# Patient Record
Sex: Male | Born: 1990 | Race: Black or African American | Hispanic: No | Marital: Single | Smoking: Current every day smoker
Health system: Southern US, Community
[De-identification: ages and names within clinical notes are randomized; demographics above are authoritative.]

## PROBLEM LIST (undated history)

## (undated) DIAGNOSIS — R569 Unspecified convulsions: Secondary | ICD-10-CM

---

## 2020-02-21 ENCOUNTER — Ambulatory Visit (HOSPITAL_COMMUNITY)
Admission: EM | Admit: 2020-02-21 | Discharge: 2020-02-21 | Disposition: A | Discharge status: Home / Self Care | Payer: Medicaid Other | Attending: Physician Assistant | Admitting: Physician Assistant

## 2020-02-21 ENCOUNTER — Encounter (HOSPITAL_COMMUNITY): Payer: Self-pay

## 2020-02-21 ENCOUNTER — Other Ambulatory Visit: Payer: Self-pay

## 2020-02-21 DIAGNOSIS — K047 Periapical abscess without sinus: Secondary | ICD-10-CM

## 2020-02-21 DIAGNOSIS — R21 Rash and other nonspecific skin eruption: Secondary | ICD-10-CM

## 2020-02-21 HISTORY — DX: Unspecified convulsions: R56.9

## 2020-02-21 MED ORDER — AMOXICILLIN-POT CLAVULANATE 875-125 MG PO TABS: 1.0000 | ORAL_TABLET | Freq: Two times a day (BID) | ORAL | 0 refills | Status: AC

## 2020-02-21 MED ORDER — PREDNISONE 10 MG PO TABS: 40.0000 mg | ORAL_TABLET | Freq: Every day | ORAL | 0 refills | Status: AC

## 2020-02-21 MED ORDER — HYDROXYZINE HCL 25 MG PO TABS: 25.0000 mg | ORAL_TABLET | Freq: Four times a day (QID) | ORAL | 0 refills | Status: DC

## 2020-02-21 NOTE — ED Triage Notes (Signed)
Pt c/o itching/rash ro bilateral arms, legs for two days. Took 50mg  Benadryl 0800. Denies SOB, facial or difficulty swallowing.  Diffuse red/raised rash noted to arms/legs. Pt somnolent, closing eyes.

## 2020-02-21 NOTE — ED Provider Notes (Addendum)
[name]    CSN: [id number] Arrival date & time: 02/21/20  0900      History   Chief Complaint Chief Complaint  Patient presents with  . Rash    HPI Eddie Sullivan is a 29 y.o. male.   Patient presents today to urgent care for complaint of rash on arms.  Reports the rash started yesterday and has been very itchy.  He also reports some itching on his legs.  He took Benadryl this morning prior to coming in is helped a little bit.  He denies fever and chills, difficulty breathing or throat swelling.  He does not have any known allergies and has not changed lotions or soaps or laundry detergents.  Denies sore throat, cough, congestion, headache.  He also reports a painful wisdom tooth that is cracked.  This has been present for some time.  However it has been hurting more recently.  He reports he is going to follow-up with a dentist about this.  Denies taking other medications or drugs other than Benadryl.     Past Medical History:  Diagnosis Date  . Seizures ([place])     There are no problems to display for this patient.   History reviewed. No pertinent surgical history.     Home Medications    Prior to Admission medications   Medication Sig Start Date End Date Taking? Authorizing Provider  amoxicillin-clavulanate (AUGMENTIN) 875-125 MG tablet Take 1 tablet by mouth 2 (two) times daily for 10 days. 02/21/20 03/02/20  Chelan Heringer, Marguerita Beards, PA-C  hydrOXYzine (ATARAX/VISTARIL) 25 MG tablet Take 1 tablet (25 mg total) by mouth every 6 (six) hours. 02/21/20   Areyanna Figeroa, Marguerita Beards, PA-C  predniSONE (DELTASONE) 10 MG tablet Take 4 tablets (40 mg total) by mouth daily for 3 days. 02/21/20 02/24/20  Kenlie Seki, Marguerita Beards, PA-C    Family History Family History  Family history unknown: Yes    Social History Social History   Tobacco Use  . Smoking status: Current Some Day Smoker  . Smokeless tobacco: Never Used  Substance Use Topics  . Alcohol use: Yes  . Drug use: Yes    Types:  Marijuana     Allergies   Patient has no known allergies.   Review of Systems Review of Systems  Constitutional: Negative for chills and fever.  HENT: Positive for dental problem. Negative for facial swelling, sore throat and trouble swallowing.   Respiratory: Negative for cough and shortness of breath.   Cardiovascular: Negative for chest pain.  Skin: Positive for color change and rash.  Neurological: Negative for dizziness, speech difficulty and light-headedness.     Physical Exam Triage Vital Signs ED Triage Vitals  Enc Vitals Group     BP 02/21/20 0919 121/84     Pulse Rate 02/21/20 0919 72     Resp 02/21/20 0919 16     Temp 02/21/20 0919 (!) 97 F (36.1 C)     Temp Source 02/21/20 0919 Oral     SpO2 02/21/20 0919 100 %     Weight --      Height --      Head Circumference --      Peak Flow --      Pain Score 02/21/20 0916 0     Pain Loc --      Pain Edu? --      Excl. in [place]? --    No data found.  Updated Vital Signs BP 121/84 (BP Location: Right Arm)  Pulse 72   Temp (!) 97 F (36.1 C) (Oral)   Resp 16   SpO2 100%   Visual Acuity Right Eye Distance:   Left Eye Distance:   Bilateral Distance:    Right Eye Near:   Left Eye Near:    Bilateral Near:     Physical Exam Vitals and nursing note reviewed.  Constitutional:      Appearance: Normal appearance. He is well-developed. He is not ill-appearing.     Comments: Sleepy demeanor.  Wearing sunglasses and listening to music as I entered the room.    HENT:     Head: Normocephalic and atraumatic.     Mouth/Throat:     Lips: Pink.     Mouth: Mucous membranes are moist. Mucous membranes are cyanotic. No oral lesions or angioedema.     Dentition: Abnormal dentition. Dental tenderness and gingival swelling present.     Tongue: No lesions.     Pharynx: Oropharynx is clear. Uvula midline. No uvula swelling.   Eyes:     General: No scleral icterus.    Extraocular Movements: Extraocular movements  intact.     Conjunctiva/sclera: Conjunctivae normal.     Pupils: Pupils are equal, round, and reactive to light.     Comments: No vascular lesions noted in sclera  Cardiovascular:     Rate and Rhythm: Normal rate and regular rhythm.     Pulses: Normal pulses.     Heart sounds: No murmur.  Pulmonary:     Effort: Pulmonary effort is normal. No respiratory distress.     Breath sounds: Normal breath sounds.  Musculoskeletal:     Cervical back: Neck supple.  Skin:    General: Skin is warm and dry.     Comments: Right antecubital space with evidence of needle mark.  Not infected.  Fine papular coalescing erythematous and blanching rash on forearms and elbows.  Rash is not present in other locations there are images below  No splinter hemorrhaging.  No lesions between fingers or toes  Neurological:     Mental Status: He is alert and oriented to person, place, and time.     Comments: Patient is sleepy and found to be dozing off in exam room upon entry.  However he responds well to conversation and answers all questions appropriately.  States he believes this is due to not sleeping much and taking Benadryl this morning.  Psychiatric:        Mood and Affect: Mood normal.        Behavior: Behavior normal.        Thought Content: Thought content normal.        Judgment: Judgment normal.          UC Treatments / Results  Labs (all labs ordered are listed, but only abnormal results are displayed) Labs Reviewed - No data to display  EKG   Radiology No results found.  Procedures Procedures (including critical care time)  Medications Ordered in UC Medications - No data to display  Initial Impression / Assessment and Plan / UC Course  I have reviewed the triage vital signs and the nursing notes.  Pertinent labs & imaging results that were available during my care of the patient were reviewed by me and considered in my medical decision making (see chart for details).      #Rash #Dental infection Patient 28 year old male presenting with skin rash and complaint of dental infection.  Rashes consistent with urticarial reaction vs contact irritant vs contact  allergy.  Patient also has apparent dental infection.  Will start on prednisone for 3 days and Augmentin x10 days.  Hydroxyzine for itching.  Strict emergency department precautions for worsening rash, swelling of the throat or tongue or difficulty breathing.  Patient reports he has a ride coming to pick him up from clinic today.  Patient has stable vital signs and alert for discharge. -Patient requested pain medications and I recommended that he take Tylenol and ibuprofen for his pain and at the antibiotic will help with his tooth pain.  Instructed him to follow-up with a dentist as soon as possible.  Final Clinical Impressions(s) / UC Diagnoses   Final diagnoses:  Rash  Dental infection     Discharge Instructions     Take the prednisone daily for the next 3 days Take the hydroxyzine at night for itching Take the Augmentin 2 times a day for 10 days and please follow-up with the dentist to discuss remove your tooth  Notice worsening rash or other symptoms such as shortness of breath or difficulty swallowing or chest pain , I like for you to return or go to the emergency department      ED Prescriptions    Medication Sig Dispense Auth. Provider   predniSONE (DELTASONE) 10 MG tablet Take 4 tablets (40 mg total) by mouth daily for 3 days. 12 tablet Marga Gramajo, Veryl Speak, PA-C   amoxicillin-clavulanate (AUGMENTIN) 875-125 MG tablet Take 1 tablet by mouth 2 (two) times daily for 10 days. 20 tablet Shawndrea Rutkowski, Veryl Speak, PA-C   hydrOXYzine (ATARAX/VISTARIL) 25 MG tablet Take 1 tablet (25 mg total) by mouth every 6 (six) hours. 12 tablet Robben Jagiello, Veryl Speak, PA-C     PDMP not reviewed this encounter.   Hermelinda Medicus, PA-C 02/21/20 1014    Emmanuell Kantz, Veryl Speak, PA-C 02/21/20 1015

## 2020-02-21 NOTE — Discharge Instructions (Signed)
Take the prednisone daily for the next 3 days Take the hydroxyzine at night for itching Take the Augmentin 2 times a day for 10 days and please follow-up with the dentist to discuss remove your tooth  Notice worsening rash or other symptoms such as shortness of breath or difficulty swallowing or chest pain , I like for you to return or go to the emergency department

## 2020-04-05 ENCOUNTER — Emergency Department (HOSPITAL_COMMUNITY): Payer: Medicaid Other

## 2020-04-05 ENCOUNTER — Emergency Department (HOSPITAL_COMMUNITY)
Admission: EM | Admit: 2020-04-05 | Discharge: 2020-04-05 | Disposition: A | Discharge status: Home / Self Care | Payer: Medicaid Other | Attending: Emergency Medicine | Admitting: Emergency Medicine

## 2020-04-05 ENCOUNTER — Other Ambulatory Visit: Payer: Self-pay

## 2020-04-05 ENCOUNTER — Encounter (HOSPITAL_COMMUNITY): Payer: Self-pay

## 2020-04-05 DIAGNOSIS — R0602 Shortness of breath: Secondary | ICD-10-CM | POA: Insufficient documentation

## 2020-04-05 DIAGNOSIS — Z113 Encounter for screening for infections with a predominantly sexual mode of transmission: Secondary | ICD-10-CM | POA: Insufficient documentation

## 2020-04-05 DIAGNOSIS — J029 Acute pharyngitis, unspecified: Secondary | ICD-10-CM | POA: Diagnosis not present

## 2020-04-05 DIAGNOSIS — F17200 Nicotine dependence, unspecified, uncomplicated: Secondary | ICD-10-CM | POA: Insufficient documentation

## 2020-04-05 DIAGNOSIS — Z20822 Contact with and (suspected) exposure to covid-19: Secondary | ICD-10-CM | POA: Insufficient documentation

## 2020-04-05 LAB — SARS CORONAVIRUS 2 (TAT 6-24 HRS): SARS Coronavirus 2: NEGATIVE

## 2020-04-05 LAB — GROUP A STREP BY PCR: Group A Strep by PCR: NOT DETECTED

## 2020-04-05 MED ORDER — LIDOCAINE VISCOUS HCL 2 % MT SOLN: 15.0000 mL | Freq: Four times a day (QID) | OROMUCOSAL | 0 refills | Status: DC | PRN

## 2020-04-05 MED ORDER — LIDOCAINE VISCOUS HCL 2 % MT SOLN
15.0000 mL | Freq: Once | OROMUCOSAL | Status: AC
  Administered 2020-04-05: 15 mL via OROMUCOSAL
  Filled 2020-04-05: qty 15

## 2020-04-05 MED ORDER — NAPROXEN 500 MG PO TABS: 500.0000 mg | ORAL_TABLET | Freq: Two times a day (BID) | ORAL | 0 refills | Status: AC

## 2020-04-05 MED ORDER — DEXAMETHASONE SODIUM PHOSPHATE 10 MG/ML IJ SOLN
10.0000 mg | Freq: Once | INTRAMUSCULAR | Status: AC
  Administered 2020-04-05: 10 mg via INTRAVENOUS
  Filled 2020-04-05: qty 1

## 2020-04-05 MED ORDER — KETOROLAC TROMETHAMINE 30 MG/ML IJ SOLN
30.0000 mg | Freq: Once | INTRAMUSCULAR | Status: AC
  Administered 2020-04-05: 30 mg via INTRAMUSCULAR
  Filled 2020-04-05: qty 1

## 2020-04-05 NOTE — ED Provider Notes (Signed)
[facility] EMERGENCY DEPARTMENT Provider Note   CSN: [id number] Arrival date & time: 04/05/20  1306     History Chief Complaint  Patient presents with  . Sore Throat    Eddie Sullivan is a 29 y.o. male with a past medical history significant for seizures who presents to the ED due to sore throat that started this morning.  Patient admits to associated shortness of breath with exertion.  Denies sick contacts and Covid exposures.  Rates his pain at 8/10 worse when swallowing.  Denies fever and chills.  Denies changes to phonation and trismus.  Patient admits to performing oral sex roughly 1 week ago.  Denies concerns for STDs at this time.  Denies cough, abdominal pain, rhinorrhea, loss of taste/smell.  History obtained from patient and past medical records. No interpreter used during encounter.      Past Medical History:  Diagnosis Date  . Seizures ([place])     There are no problems to display for this patient.   History reviewed. No pertinent surgical history.     Family History  Family history unknown: Yes    Social History   Tobacco Use  . Smoking status: Current Some Day Smoker  . Smokeless tobacco: Never Used  Substance Use Topics  . Alcohol use: Yes  . Drug use: Yes    Types: Marijuana    Home Medications Prior to Admission medications   Medication Sig Start Date End Date Taking? Authorizing Provider  hydrOXYzine (ATARAX/VISTARIL) 25 MG tablet Take 1 tablet (25 mg total) by mouth every 6 (six) hours. Patient not taking: Reported on 04/05/2020 02/21/20   Darr, Marguerita Beards, PA-C  lidocaine (XYLOCAINE) 2 % solution Use as directed 15 mLs in the mouth or throat every 6 (six) hours as needed for mouth pain. 04/05/20   Suzy Bouchard, PA-C  naproxen (NAPROSYN) 500 MG tablet Take 1 tablet (500 mg total) by mouth 2 (two) times daily. 04/05/20   Suzy Bouchard, PA-C    Allergies    Patient has no known allergies.  Review of Systems     Review of Systems  Constitutional: Negative for chills and fever.  HENT: Positive for sore throat. Negative for rhinorrhea, trouble swallowing and voice change.   Respiratory: Positive for shortness of breath. Negative for cough.   Cardiovascular: Negative for chest pain and leg swelling.  Gastrointestinal: Negative for abdominal pain, diarrhea, nausea and vomiting.  Musculoskeletal: Negative for neck stiffness.  Neurological: Negative for headaches.  All other systems reviewed and are negative.   Physical Exam Updated Vital Signs BP (!) 130/94 (BP Location: Left Arm)   Pulse 63   Temp 98.6 F (37 C) (Oral)   Resp 12   Ht 5\' 10"  (1.778 m)   Wt 72.6 kg   SpO2 100%   BMI 22.96 kg/m   Physical Exam Vitals and nursing note reviewed.  Constitutional:      General: He is not in acute distress.    Appearance: He is not ill-appearing.  HENT:     Head: Normocephalic.     Mouth/Throat:     Comments: Posterior oropharynx clear and mucous membranes moist, there is mild erythema but no edema or tonsillar exudates, uvula midline, Uvula edematous. normal phonation, no trismus, tolerating secretions without difficulty.  Eyes:     Pupils: Pupils are equal, round, and reactive to light.  Neck:     Comments: No meningismus. Cardiovascular:     Rate and Rhythm: Normal  rate and regular rhythm.     Pulses: Normal pulses.     Heart sounds: Normal heart sounds. No murmur. No friction rub. No gallop.   Pulmonary:     Effort: Pulmonary effort is normal.     Breath sounds: Normal breath sounds.  Abdominal:     General: Abdomen is flat. There is no distension.     Palpations: Abdomen is soft.     Tenderness: There is no abdominal tenderness. There is no guarding or rebound.  Musculoskeletal:     Cervical back: Neck supple.     Comments: No lower extremity edema.  Skin:    General: Skin is warm and dry.  Neurological:     General: No focal deficit present.     Mental Status: He is alert.   Psychiatric:        Mood and Affect: Mood normal.        Behavior: Behavior normal.     ED Results / Procedures / Treatments   Labs (all labs ordered are listed, but only abnormal results are displayed) Labs Reviewed  GROUP A STREP BY PCR  SARS CORONAVIRUS 2 (TAT 6-24 HRS)  GC/CHLAMYDIA PROBE AMP ([place]) NOT AT [facility]    EKG None  Radiology DG Chest Portable 1 View  Result Date: 04/05/2020 CLINICAL DATA:  Shortness of breath EXAM: PORTABLE CHEST 1 VIEW COMPARISON:  None FINDINGS: Metallic necklace at the base of the neck. No consolidation, features of edema, pneumothorax, or effusion. Pulmonary vascularity is normally distributed. The cardiomediastinal contours are unremarkable. No acute osseous or soft tissue abnormality. IMPRESSION: No acute cardiopulmonary abnormality. Electronically Signed   By: Kreg Shropshire M.D.   On: 04/05/2020 17:21    Procedures Procedures (including critical care time)  Medications Ordered in ED Medications  ketorolac (TORADOL) 30 MG/ML injection 30 mg (30 mg Intramuscular Given 04/05/20 1730)  dexamethasone (DECADRON) injection 10 mg (10 mg Intravenous Given 04/05/20 1730)  lidocaine (XYLOCAINE) 2 % viscous mouth solution 15 mL (15 mLs Mouth/Throat Given 04/05/20 1726)    ED Course  I have reviewed the triage vital signs and the nursing notes.  Pertinent labs & imaging results that were available during my care of the patient were reviewed by me and considered in my medical decision making (see chart for details).    MDM Rules/Calculators/A&P                     29 year old male presents to the ED due to sore throat that started this morning associated with shortness of breath.  Denies sick contacts and Covid exposures.  Stable vitals.  Patient is afebrile, not tachycardic or hypoxic.  Patient in no acute distress and non-ill-appearing.  Physical exam reassuring.  Lungs clear to auscultation bilaterally.  Mild erythema in throat with uvula  edema.  Uvula midline.  No trismus.  Normal phonation.  No concern for peritonsillar or retropharyngeal abscess at this time.  Strep test obtained at triage.  Will add Covid and gonorrhea/chlamydia.  Also obtain chest x-ray to rule out pneumonia given patient shortness of breath.  Decadron, Toradol, viscous lidocaine given to patient for symptomatic relief.  Strep test negative.  Covid and gonorrhea/chlamydia pending.  Chest x-ray personally reviewed which is negative for signs of pneumonia, pneumothorax, or widened mediastinum.   6:41 PM reassessed patient at bedside.  He is able to tolerate p.o. without difficulty.  Will discharge patient with pain medication and viscous lidocaine.  Advised patient to follow-up with  PCP if symptoms not improved within the next week. Strict ED precautions discussed with patient. Patient states understanding and agrees to plan. Patient discharged home in no acute distress and stable vitals.  Final Clinical Impression(s) / ED Diagnoses Final diagnoses:  Pharyngitis, unspecified etiology    Rx / DC Orders ED Discharge Orders         Ordered    naproxen (NAPROSYN) 500 MG tablet  2 times daily     04/05/20 1843    lidocaine (XYLOCAINE) 2 % solution  Every 6 hours PRN     04/05/20 1843           Jesusita Oka 04/05/20 1847    Eber Hong, MD 04/06/20 1452

## 2020-04-05 NOTE — ED Triage Notes (Addendum)
Pt arrives to ED w/ c/o "throat swelling" since this AM. Pt resp e/u, spo2 wnl. Pt reports 8/10 pain. Pt denies allergen exposure, denies fever/chills.

## 2020-04-05 NOTE — Discharge Instructions (Signed)
As discussed, your strep test was negative today.  Your Covid and gonorrhea/chlamydia testing are pending.  I am sending home with pain medication and numbing solution for your throat.  You were also given steroids here in the ER to help with swelling that will continue to work over the next 72 hours.  Please follow-up with your PCP if symptoms do not improve within the next week.  If you do not have a PCP, I have provided the number for [facility].  Return to the ER for new or worsening symptoms.

## 2020-04-05 NOTE — ED Notes (Signed)
This Pt called a cab

## 2020-04-05 NOTE — ED Notes (Signed)
Patient verbalizes understanding of discharge instructions. Opportunity for questioning and answers were provided. Armband removed by staff, pt discharged from ED ambulatory to home in a cab.

## 2020-04-06 LAB — GC/CHLAMYDIA PROBE AMP (~~LOC~~) NOT AT ARMC
Chlamydia: NEGATIVE
Comment: NEGATIVE
Comment: NORMAL
Neisseria Gonorrhea: NEGATIVE

## 2020-06-20 ENCOUNTER — Other Ambulatory Visit: Payer: Self-pay

## 2020-06-20 ENCOUNTER — Emergency Department (HOSPITAL_COMMUNITY): Payer: Medicaid Other

## 2020-06-20 ENCOUNTER — Encounter (HOSPITAL_COMMUNITY): Payer: Self-pay

## 2020-06-20 ENCOUNTER — Emergency Department (HOSPITAL_COMMUNITY)
Admission: EM | Admit: 2020-06-20 | Discharge: 2020-06-20 | Disposition: A | Discharge status: Home / Self Care | Payer: Medicaid Other | Attending: Emergency Medicine | Admitting: Emergency Medicine

## 2020-06-20 DIAGNOSIS — F12920 Cannabis use, unspecified with intoxication, uncomplicated: Secondary | ICD-10-CM | POA: Insufficient documentation

## 2020-06-20 DIAGNOSIS — S0181XA Laceration without foreign body of other part of head, initial encounter: Secondary | ICD-10-CM

## 2020-06-20 DIAGNOSIS — F17200 Nicotine dependence, unspecified, uncomplicated: Secondary | ICD-10-CM | POA: Insufficient documentation

## 2020-06-20 DIAGNOSIS — Z23 Encounter for immunization: Secondary | ICD-10-CM | POA: Diagnosis not present

## 2020-06-20 DIAGNOSIS — Z20822 Contact with and (suspected) exposure to covid-19: Secondary | ICD-10-CM | POA: Insufficient documentation

## 2020-06-20 DIAGNOSIS — Y929 Unspecified place or not applicable: Secondary | ICD-10-CM | POA: Diagnosis not present

## 2020-06-20 DIAGNOSIS — Y939 Activity, unspecified: Secondary | ICD-10-CM | POA: Insufficient documentation

## 2020-06-20 DIAGNOSIS — X58XXXA Exposure to other specified factors, initial encounter: Secondary | ICD-10-CM | POA: Diagnosis not present

## 2020-06-20 DIAGNOSIS — Y999 Unspecified external cause status: Secondary | ICD-10-CM | POA: Diagnosis not present

## 2020-06-20 LAB — COMPREHENSIVE METABOLIC PANEL
ALT: 25 U/L (ref 0–44)
AST: 32 U/L (ref 15–41)
Albumin: 3.6 g/dL (ref 3.5–5.0)
Alkaline Phosphatase: 41 U/L (ref 38–126)
Anion gap: 13 (ref 5–15)
BUN: 21 mg/dL — ABNORMAL HIGH (ref 6–20)
CO2: 19 mmol/L — ABNORMAL LOW (ref 22–32)
Calcium: 8.3 mg/dL — ABNORMAL LOW (ref 8.9–10.3)
Chloride: 109 mmol/L (ref 98–111)
Creatinine, Ser: 0.93 mg/dL (ref 0.61–1.24)
GFR calc Af Amer: >60 mL/min (ref >60)
GFR calc non Af Amer: >60 mL/min (ref >60)
Glucose, Bld: 101 mg/dL — ABNORMAL HIGH (ref 70–99)
Potassium: 4 mmol/L (ref 3.5–5.1)
Sodium: 141 mmol/L (ref 135–145)
Total Bilirubin: 0.5 mg/dL (ref 0.3–1.2)
Total Protein: 6.5 g/dL (ref 6.5–8.1)

## 2020-06-20 LAB — CBC WITH DIFFERENTIAL/PLATELET
Abs Immature Granulocytes: 0.04 10*3/uL (ref 0.00–0.07)
Basophils Absolute: 0 10*3/uL (ref 0.0–0.1)
Basophils Relative: 0 %
Eosinophils Absolute: 0 10*3/uL (ref 0.0–0.5)
Eosinophils Relative: 0 %
HCT: 45.6 % (ref 39.0–52.0)
Hemoglobin: 15.1 g/dL (ref 13.0–17.0)
Immature Granulocytes: 0 %
Lymphocytes Relative: 26 %
Lymphs Abs: 2.4 10*3/uL (ref 0.7–4.0)
MCH: 31 pg (ref 26.0–34.0)
MCHC: 33.1 g/dL (ref 30.0–36.0)
MCV: 93.6 fL (ref 80.0–100.0)
Monocytes Absolute: 0.7 10*3/uL (ref 0.1–1.0)
Monocytes Relative: 8 %
Neutro Abs: 5.9 10*3/uL (ref 1.7–7.7)
Neutrophils Relative %: 66 %
Platelets: 277 10*3/uL (ref 150–400)
RBC: 4.87 MIL/uL (ref 4.22–5.81)
RDW: 14.4 % (ref 11.5–15.5)
WBC: 9 10*3/uL (ref 4.0–10.5)
nRBC: 0 % (ref 0.0–0.2)

## 2020-06-20 LAB — CBG MONITORING, ED: Glucose-Capillary: 98 mg/dL (ref 70–99)

## 2020-06-20 LAB — ACETAMINOPHEN LEVEL: Acetaminophen (Tylenol), Serum: <10 ug/mL — ABNORMAL LOW (ref 10–30)

## 2020-06-20 LAB — RAPID URINE DRUG SCREEN, HOSP PERFORMED
Amphetamines: NOT DETECTED
Barbiturates: NOT DETECTED
Benzodiazepines: NOT DETECTED
Cocaine: NOT DETECTED
Opiates: NOT DETECTED
Tetrahydrocannabinol: POSITIVE — AB

## 2020-06-20 LAB — SALICYLATE LEVEL: Salicylate Lvl: <7 mg/dL — ABNORMAL LOW (ref 7.0–30.0)

## 2020-06-20 LAB — SARS CORONAVIRUS 2 BY RT PCR (HOSPITAL ORDER, PERFORMED IN ~~LOC~~ HOSPITAL LAB): SARS Coronavirus 2: NEGATIVE

## 2020-06-20 LAB — ETHANOL: Alcohol, Ethyl (B): 56 mg/dL — ABNORMAL HIGH (ref <10)

## 2020-06-20 MED ORDER — ALUM & MAG HYDROXIDE-SIMETH 200-200-20 MG/5ML PO SUSP: 30.0000 mL | Freq: Four times a day (QID) | ORAL | Status: DC | PRN

## 2020-06-20 MED ORDER — TETANUS-DIPHTH-ACELL PERTUSSIS 5-2.5-18.5 LF-MCG/0.5 IM SUSP
0.5000 mL | Freq: Once | INTRAMUSCULAR | Status: AC
  Administered 2020-06-20: 0.5 mL via INTRAMUSCULAR
  Filled 2020-06-20: qty 0.5

## 2020-06-20 MED ORDER — ONDANSETRON HCL 4 MG PO TABS: 4.0000 mg | ORAL_TABLET | Freq: Three times a day (TID) | ORAL | Status: DC | PRN

## 2020-06-20 NOTE — ED Triage Notes (Signed)
Pt refuses to give [place] or the police his name and he has no idea

## 2020-06-20 NOTE — ED Provider Notes (Signed)
[facility]-EMERGENCY DEPT Provider Note   CSN: [id number] Arrival date & time: 06/20/20  0059     History No chief complaint on file.   Eddie Sullivan is a 29 y.o. male.  The history is provided by the police and medical records. The history is limited by the condition of the patient (refusing to speak or even give name ).  Head Laceration This is a new problem. The current episode started less than 1 hour ago. The problem occurs constantly. The problem has not changed since onset.Pertinent negatives include no chest pain, no abdominal pain, no headaches and no shortness of breath. Nothing aggravates the symptoms. Nothing relieves the symptoms. He has tried nothing for the symptoms. The treatment provided no relief.  Patient here with police for banging his head on their car.  Now they have taken out IVC papers on him instead of taking him to jail.       History reviewed. No pertinent past medical history.  There are no problems to display for this patient.   History reviewed. No pertinent surgical history.     History reviewed. No pertinent family history.  Social History   Tobacco Use  . Smoking status: Current Every Day Smoker  . Smokeless tobacco: Never Used  Substance Use Topics  . Alcohol use: Yes  . Drug use: Yes    Home Medications Prior to Admission medications   Not on File    Allergies    Patient has no allergy information on record.  Review of Systems   Review of Systems  Respiratory: Negative for shortness of breath.   Cardiovascular: Negative for chest pain.  Gastrointestinal: Negative for abdominal pain.  Neurological: Negative for headaches.  All other systems reviewed and are negative.   Physical Exam Updated Vital Signs BP 109/81 (BP Location: Left Arm)   Pulse (!) 114   Temp 99.3 F (37.4 C) (Oral)   Resp (!) 22   SpO2 96%   Physical Exam Vitals and nursing note reviewed.  Constitutional:      General: He  is not in acute distress.    Appearance: Normal appearance.  HENT:     Head: Normocephalic. No raccoon eyes or Battle's sign.      Nose: Nose normal.  Eyes:     Conjunctiva/sclera: Conjunctivae normal.     Pupils: Pupils are equal, round, and reactive to light.  Cardiovascular:     Rate and Rhythm: Normal rate and regular rhythm.     Pulses: Normal pulses.     Heart sounds: Normal heart sounds.  Pulmonary:     Effort: Pulmonary effort is normal.     Breath sounds: Normal breath sounds.  Abdominal:     General: Abdomen is flat. Bowel sounds are normal.     Tenderness: There is no abdominal tenderness. There is no guarding or rebound.  Musculoskeletal:        General: Normal range of motion.     Cervical back: Normal range of motion and neck supple.  Skin:    General: Skin is warm and dry.     Capillary Refill: Capillary refill takes less than 2 seconds.  Neurological:     General: No focal deficit present.     Mental Status: He is alert.     Deep Tendon Reflexes: Reflexes normal.  Psychiatric:     Comments: Will not speak      ED Results / Procedures / Treatments   Labs (all labs ordered  are listed, but only abnormal results are displayed) Labs Reviewed - No data to display  EKG None  Radiology Results for orders placed or performed during the hospital encounter of 06/20/20  Comprehensive metabolic panel  Result Value Ref Range   Sodium 141 135 - 145 mmol/L   Potassium 4.0 3.5 - 5.1 mmol/L   Chloride 109 98 - 111 mmol/L   CO2 19 (L) 22 - 32 mmol/L   Glucose, Bld 101 (H) 70 - 99 mg/dL   BUN 21 (H) 6 - 20 mg/dL   Creatinine, Ser 3.50 0.61 - 1.24 mg/dL   Calcium 8.3 (L) 8.9 - 10.3 mg/dL   Total Protein 6.5 6.5 - 8.1 g/dL   Albumin 3.6 3.5 - 5.0 g/dL   AST 32 15 - 41 U/L   ALT 25 0 - 44 U/L   Alkaline Phosphatase 41 38 - 126 U/L   Total Bilirubin 0.5 0.3 - 1.2 mg/dL   GFR calc non Af Amer >60 >60 mL/min   GFR calc Af Amer >60 >60 mL/min   Anion gap 13 5 - 15    Salicylate level  Result Value Ref Range   Salicylate Lvl <7.0 (L) 7.0 - 30.0 mg/dL  Acetaminophen level  Result Value Ref Range   Acetaminophen (Tylenol), Serum <10 (L) 10 - 30 ug/mL  Ethanol  Result Value Ref Range   Alcohol, Ethyl (B) 56 (H) <10 mg/dL  Urine rapid drug screen (hosp performed)  Result Value Ref Range   Opiates NONE DETECTED NONE DETECTED   Cocaine NONE DETECTED NONE DETECTED   Benzodiazepines NONE DETECTED NONE DETECTED   Amphetamines NONE DETECTED NONE DETECTED   Tetrahydrocannabinol POSITIVE (A) NONE DETECTED   Barbiturates NONE DETECTED NONE DETECTED  CBC WITH DIFFERENTIAL  Result Value Ref Range   WBC 9.0 4.0 - 10.5 K/uL   RBC 4.87 4.22 - 5.81 MIL/uL   Hemoglobin 15.1 13.0 - 17.0 g/dL   HCT 09.3 39 - 52 %   MCV 93.6 80.0 - 100.0 fL   MCH 31.0 26.0 - 34.0 pg   MCHC 33.1 30.0 - 36.0 g/dL   RDW 81.8 29.9 - 37.1 %   Platelets 277 150 - 400 K/uL   nRBC 0.0 0.0 - 0.2 %   Neutrophils Relative % 66 %   Neutro Abs 5.9 1.7 - 7.7 K/uL   Lymphocytes Relative 26 %   Lymphs Abs 2.4 0.7 - 4.0 K/uL   Monocytes Relative 8 %   Monocytes Absolute 0.7 0 - 1 K/uL   Eosinophils Relative 0 %   Eosinophils Absolute 0.0 0 - 0 K/uL   Basophils Relative 0 %   Basophils Absolute 0.0 0 - 0 K/uL   Immature Granulocytes 0 %   Abs Immature Granulocytes 0.04 0.00 - 0.07 K/uL  CBG monitoring, ED  Result Value Ref Range   Glucose-Capillary 98 70 - 99 mg/dL   CT Head Wo Contrast  Result Date: 06/20/2020 CLINICAL DATA:  Headaches related to blunt trauma, initial encounter EXAM: CT HEAD WITHOUT CONTRAST TECHNIQUE: Contiguous axial images were obtained from the base of the skull through the vertex without intravenous contrast. COMPARISON:  None. FINDINGS: Brain: No evidence of acute infarction, hemorrhage, hydrocephalus, extra-axial collection or mass lesion/mass effect. Vascular: No hyperdense vessel or unexpected calcification. Skull: Normal. Negative for fracture or focal lesion.  Sinuses/Orbits: No acute finding. Other: None. IMPRESSION: No acute abnormality noted. Electronically Signed   By: Alcide Clever M.D.   On: 06/20/2020 01:45  Procedures .Marland KitchenLaceration Repair  Date/Time: 06/20/2020 3:30 AM Performed by: Cy Blamer, MD Authorized by: Cy Blamer, MD   Consent:    Consent obtained:  Verbal   Risks discussed:  Infection, need for additional repair, nerve damage, pain, poor cosmetic result, poor wound healing, retained foreign body, tendon damage and vascular damage   Alternatives discussed:  No treatment Anesthesia (see MAR for exact dosages):    Anesthesia method:  None Laceration details:    Location:  Face   Face location:  Forehead   Length (cm):  1   Depth (mm):  1 Repair type:    Repair type:  Simple Pre-procedure details:    Preparation:  Patient was prepped and draped in usual sterile fashion Exploration:    Hemostasis achieved with:  Direct pressure   Wound exploration: wound explored through full range of motion     Wound extent: no areolar tissue violation noted     Contaminated: no   Treatment:    Area cleansed with:  Betadine   Amount of cleaning:  Standard Skin repair:    Repair method:  Tissue adhesive Approximation:    Approximation:  Close Post-procedure details:    Dressing:  Open (no dressing)   Patient tolerance of procedure:  Tolerated well, no immediate complications   (including critical care time)  Medications Ordered in ED Medications  Tdap (BOOSTRIX) injection 0.5 mL (has no administration in time range)    ED Course  I have reviewed the triage vital signs and the nursing notes.  Pertinent labs & imaging results that were available during my care of the patient were reviewed by me and considered in my medical decision making (see chart for details).    Patient under IVC by police.  Patient is medically cleared by me.     Medically cleared by me for psychiatry, dispo per that service  The patient has  been placed in psychiatric observation due to the need to provide a safe environment for the patient while obtaining psychiatric consultation and evaluation, as well as ongoing medical and medication management to treat the patient's condition.  The patient has been placed under full IVC at this time.  Final Clinical Impression(s) / ED Diagnoses Holding orders placed.     Damyiah Moxley, MD 06/20/20 1749

## 2020-06-20 NOTE — ED Provider Notes (Signed)
Pt has been psych cleared.  Pt is stable for d/c.   Jacalyn Lefevre, MD 06/20/20 1057

## 2020-06-20 NOTE — ED Triage Notes (Signed)
Pt is escorted by [employer] and needs to be medically cleared before going to jail, he was arrested and started beating his head in the police car

## 2020-06-20 NOTE — ED Notes (Signed)
Per hi sister pt is schizophrenic, bipolar, and has depression His Dr's are in [place] His sister doesn't know the name of his doctors or what meds he's suppose to take

## 2020-06-20 NOTE — Discharge Instructions (Signed)
For your mental health needs, you are advised to follow up with [facility].  Contact them at your earliest opportunity to schedule an intake appointment:       [facility]      [address], [address]      [place], [place] [address]      [phone]

## 2020-06-20 NOTE — BH Assessment (Signed)
[facility] Assessment Progress Note  Per Archana Kumar, MD, this pt does not require psychiatric hospitalization at this time.  Pt is to be discharged from [facility] with recommendation to follow up with [facility].  This has been included in pt's discharge instructions.  Pt's nurse has been notified.  Eddie Dais, MA Triage Specialist [phone]     

## 2020-06-20 NOTE — ED Notes (Signed)
Patient cleared by TTS for discharge.  Patient threatening to become violent if we discharge him.  [employer] already at bedside with patient.  Patietn escorted out of the ER by security and [employer].

## 2020-06-20 NOTE — BH Assessment (Addendum)
Comprehensive Clinical Assessment (CCA) Note  06/20/2020 Eddie Sullivan [id number]  Visit Diagnosis:      ICD-10-CM   1. Facial laceration, initial encounter  S01.81XA     Cannabis Use; malingering  CCA Screening, Triage and Referral (STR)  Patient Reported Information How did you hear about [place]? Family/Friend  Referral name: No data recorded Referral phone number: No data recorded  Whom do you see for routine medical problems? I don't have a doctor  Practice/Facility Name: No data recorded Practice/Facility Phone Number: No data recorded Name of Contact: No data recorded Contact Number: No data recorded Contact Fax Number: No data recorded Prescriber Name: No data recorded Prescriber Address (if known): No data recorded  What Is the Reason for Your Visit/Call Today? Unknown -- Pt was arrested and bangedhis head against police car wall  How Long Has This Been Causing You Problems? <Week  What Do You Feel Would Help You the Most Today? Other (Comment) (Put me inside so I don't hurt anyone)   Have You Recently Been in Any Inpatient Treatment (Hospital/Detox/Crisis Center/28-Day Program)? No  Name/Location of Program/Hospital:No data recorded How Long Were You There? No data recorded When Were You Discharged? No data recorded  Have You Ever Received Services From [facility] Before? No  Who Do You See at [facility]? No data recorded  Have You Recently Had Any Thoughts About Hurting Yourself? No  Are You Planning to Commit Suicide/Harm Yourself At This time? No   Have you Recently Had Thoughts About Hurting Someone Karolee Ohs? No  Explanation: No data recorded  Have You Used Any Alcohol or Drugs in the Past 24 Hours? Yes  How Long Ago Did You Use Drugs or Alcohol? No data recorded What Did You Use and How Much? Marijuana, unknown   Do You Currently Have a Therapist/Psychiatrist? Yes  Name of Therapist/Psychiatrist: Per notes, Pt has doctors in [place], [place],  but Pt would not recall names   Have You Been Recently Discharged From Any Office Practice or Programs? No  Explanation of Discharge From Practice/Program: No data recorded    CCA Screening Triage Referral Assessment Type of Contact: Tele-Assessment  Is this Initial or Reassessment? Initial Assessment  Date Telepsych consult ordered in [facility]:  No data recorded Time Telepsych consult ordered in [facility]:  No data recorded  Patient Reported Information Reviewed? Yes  Patient Left Without Being Seen? No data recorded Reason for Not Completing Assessment: No data recorded  Collateral Involvement: Collateral was taken from sister   Does Patient Have a Court Appointed Legal Guardian? No data recorded Name and Contact of Legal Guardian: No data recorded If Minor and Not Living with Parent(s), Who has Custody? No data recorded Is CPS involved or ever been involved? Never  Is APS involved or ever been involved? Never   Patient Determined To Be At Risk for Harm To Self or Others Based on Review of Patient Reported Information or Presenting Complaint? No  Method: No data recorded Availability of Means: No data recorded Intent: No data recorded Notification Required: No data recorded Additional Information for Danger to Others Potential: No data recorded Additional Comments for Danger to Others Potential: No data recorded Are There Guns or Other Weapons in Your Home? No data recorded Types of Guns/Weapons: No data recorded Are These Weapons Safely Secured?                            No data recorded Who  Could Verify You Are Able To Have These Secured: No data recorded Do You Have any Outstanding Charges, Pending Court Dates, Parole/Probation? No data recorded Contacted To Inform of Risk of Harm To Self or Others: No data recorded  [facility] of Assessment: [facility]   Does Patient Present under Involuntary Commitment? No  IVC Papers Initial File Date: No data recorded  [place] of  Residence: [place]   Patient Currently Receiving the Following Services: Individual Therapy   Determination of Need: Urgent (48 hours)   Options For Referral: Medication Management;Outpatient Therapy     CCA Biopsychosocial  Intake/Chief Complaint:   Pt is a 29 year old male who presented to [facility] under police escort after being arrested and hitting his head against the wall of the police car.  Pt lives in [place], and per history, he receives outpatient services in [place], [place].  Pt's sister was listed as a contact, but there is no contact information.  Pt's UDS was positive for marijuana.  Per hospital note, Pt needs clearance before going to jail.  Pt was drowsy and slow to respond.  To Alyse Low, MD, he stated that he does not know why he is at the hospital.  He was sleepy, pulled the sheet over his head.  During conversation, Dr. Lucianne Muss asked for contact information for sister, but he said he could not remember.  Pt said, ''Y'all need to put me in a psych ward because I'll hurt someone if I leave.''  When asked about psychiatric history, Pt laughed - ''You're confusing me.''  Pt denied suicidal ideation.  Mental Health Symptoms Depression:     Mania:     Anxiety:      Psychosis:     Trauma:     Obsessions:     Compulsions:     Inattention:     Hyperactivity/Impulsivity:     Oppositional/Defiant Behaviors:     Emotional Irregularity:     Other Mood/Personality Symptoms:      Mental Status Exam Appearance and self-care  Stature:     Weight:     Clothing:     Grooming:     Cosmetic use:     Posture/gait:     Motor activity:     Sensorium  Attention:     Concentration:     Orientation:     Recall/memory:     Affect and Mood  Affect:     Mood:     Relating  Eye contact:     Facial expression:     Attitude toward examiner:     Thought and Language  Speech flow:    Thought content:     Preoccupation:     Hallucinations:     Organization:      Company secretary of Knowledge:     Intelligence:     Abstraction:     Judgement:     Reality Testing:     Insight:     Decision Making:     Social Functioning  Social Maturity:     Social Judgement:     Stress  Stressors:     Coping Ability:     Skill Deficits:     Supports:        Religion:    Leisure/Recreation:    Exercise/Diet:     CCA Employment/Education  Employment/Work Situation:    Education:     CCA Family/Childhood History  Family and Relationship History:    Childhood History:  Child/Adolescent Assessment:     CCA Substance Use  Alcohol/Drug Use:                           ASAM's:  Six Dimensions of Multidimensional Assessment  Dimension 1:  Acute Intoxication and/or Withdrawal Potential:      Dimension 2:  Biomedical Conditions and Complications:      Dimension 3:  Emotional, Behavioral, or Cognitive Conditions and Complications:     Dimension 4:  Readiness to Change:     Dimension 5:  Relapse, Continued use, or Continued Problem Potential:     Dimension 6:  Recovery/Living Environment:     ASAM Severity Score:    ASAM Recommended Level of Treatment:     Substance use Disorder (SUD)    Recommendations for Services/Supports/Treatments:    DSM5 Diagnoses: Patient Active Problem List   Diagnosis Date Noted  . Cannabis intoxication without complication [facility])     Patient Centered Plan: Patient is on the following Treatment Plan(s):     Referrals to Alternative Service(s): Referred to Alternative Service(s):   Place:   Date:   Time:    Referred to Alternative Service(s):   Place:   Date:   Time:    Referred to Alternative Service(s):   Place:   Date:   Time:    Referred to Alternative Service(s):   Place:   Date:   Time:     MSE:  Pt was drowsy and not oriented.  He had poor eye contact.  Demeanor was guarded.  Pt's mood and affect were irritable and ambivalent.  Pt's speech was normal in rate,  rhythm, and volume.  Pt's thought processes were within normal range, and thought content was logical and goal-oriented.  There was no evidence of delusion or other signs of psychosis. Insight, judgment, and impulse control were poor.  DISPOSITION:  Per Alyse Low, MD, Pt is psych-cleared. Dorris Fetch Joani Cosma

## 2020-06-20 NOTE — ED Notes (Signed)
TTS consult taking place at this time 

## 2020-08-03 ENCOUNTER — Ambulatory Visit (HOSPITAL_COMMUNITY)
Admission: EM | Admit: 2020-08-03 | Discharge: 2020-08-04 | Disposition: A | Discharge status: Home / Self Care | Payer: Medicaid Other | Attending: Psychiatry | Admitting: Psychiatry

## 2020-08-03 ENCOUNTER — Other Ambulatory Visit: Payer: Self-pay

## 2020-08-03 DIAGNOSIS — R45850 Homicidal ideations: Secondary | ICD-10-CM | POA: Insufficient documentation

## 2020-08-03 DIAGNOSIS — G4700 Insomnia, unspecified: Secondary | ICD-10-CM | POA: Insufficient documentation

## 2020-08-03 DIAGNOSIS — R45851 Suicidal ideations: Secondary | ICD-10-CM | POA: Insufficient documentation

## 2020-08-03 DIAGNOSIS — Z590 Homelessness: Secondary | ICD-10-CM | POA: Insufficient documentation

## 2020-08-03 DIAGNOSIS — F319 Bipolar disorder, unspecified: Secondary | ICD-10-CM | POA: Insufficient documentation

## 2020-08-03 DIAGNOSIS — Z20822 Contact with and (suspected) exposure to covid-19: Secondary | ICD-10-CM | POA: Insufficient documentation

## 2020-08-03 DIAGNOSIS — R569 Unspecified convulsions: Secondary | ICD-10-CM | POA: Insufficient documentation

## 2020-08-03 DIAGNOSIS — F3160 Bipolar disorder, current episode mixed, unspecified: Secondary | ICD-10-CM

## 2020-08-03 DIAGNOSIS — F17200 Nicotine dependence, unspecified, uncomplicated: Secondary | ICD-10-CM | POA: Insufficient documentation

## 2020-08-03 DIAGNOSIS — R441 Visual hallucinations: Secondary | ICD-10-CM | POA: Insufficient documentation

## 2020-08-03 LAB — POC SARS CORONAVIRUS 2 AG -  ED: SARS Coronavirus 2 Ag: NEGATIVE

## 2020-08-03 LAB — CBC WITH DIFFERENTIAL/PLATELET
Abs Immature Granulocytes: 0.02 10*3/uL (ref 0.00–0.07)
Basophils Absolute: 0 10*3/uL (ref 0.0–0.1)
Basophils Relative: 0 %
Eosinophils Absolute: 0.1 10*3/uL (ref 0.0–0.5)
Eosinophils Relative: 1 %
HCT: 40.2 % (ref 39.0–52.0)
Hemoglobin: 12.6 g/dL — ABNORMAL LOW (ref 13.0–17.0)
Immature Granulocytes: 0 %
Lymphocytes Relative: 23 %
Lymphs Abs: 1.8 10*3/uL (ref 0.7–4.0)
MCH: 30.8 pg (ref 26.0–34.0)
MCHC: 31.3 g/dL (ref 30.0–36.0)
MCV: 98.3 fL (ref 80.0–100.0)
Monocytes Absolute: 0.7 10*3/uL (ref 0.1–1.0)
Monocytes Relative: 9 %
Neutro Abs: 5.3 10*3/uL (ref 1.7–7.7)
Neutrophils Relative %: 67 %
Platelets: 268 10*3/uL (ref 150–400)
RBC: 4.09 MIL/uL — ABNORMAL LOW (ref 4.22–5.81)
RDW: 15.3 % (ref 11.5–15.5)
WBC: 7.9 10*3/uL (ref 4.0–10.5)
nRBC: 0 % (ref 0.0–0.2)

## 2020-08-03 LAB — POCT URINE DRUG SCREEN - MANUAL ENTRY (I-SCREEN)
POC Amphetamine UR: NOT DETECTED
POC Buprenorphine (BUP): NOT DETECTED
POC Cocaine UR: NOT DETECTED
POC Marijuana UR: POSITIVE — AB
POC Methadone UR: NOT DETECTED
POC Methamphetamine UR: NOT DETECTED
POC Morphine: NOT DETECTED
POC Oxazepam (BZO): NOT DETECTED
POC Oxycodone UR: NOT DETECTED
POC Secobarbital (BAR): NOT DETECTED

## 2020-08-03 LAB — COMPREHENSIVE METABOLIC PANEL
ALT: 25 U/L (ref 0–44)
AST: 25 U/L (ref 15–41)
Albumin: 3.4 g/dL — ABNORMAL LOW (ref 3.5–5.0)
Alkaline Phosphatase: 39 U/L (ref 38–126)
Anion gap: 10 (ref 5–15)
BUN: 16 mg/dL (ref 6–20)
CO2: 30 mmol/L (ref 22–32)
Calcium: 9.4 mg/dL (ref 8.9–10.3)
Chloride: 105 mmol/L (ref 98–111)
Creatinine, Ser: 0.9 mg/dL (ref 0.61–1.24)
GFR calc Af Amer: >60 mL/min (ref >60)
GFR calc non Af Amer: >60 mL/min (ref >60)
Glucose, Bld: 62 mg/dL — ABNORMAL LOW (ref 70–99)
Potassium: 3.8 mmol/L (ref 3.5–5.1)
Sodium: 145 mmol/L (ref 135–145)
Total Bilirubin: 0.7 mg/dL (ref 0.3–1.2)
Total Protein: 5.8 g/dL — ABNORMAL LOW (ref 6.5–8.1)

## 2020-08-03 LAB — SARS CORONAVIRUS 2 BY RT PCR (HOSPITAL ORDER, PERFORMED IN ~~LOC~~ HOSPITAL LAB): SARS Coronavirus 2: NEGATIVE

## 2020-08-03 LAB — HEMOGLOBIN A1C
Hgb A1c MFr Bld: 5.2 % (ref 4.8–5.6)
Mean Plasma Glucose: 102.54 mg/dL

## 2020-08-03 LAB — ETHANOL: Alcohol, Ethyl (B): <10 mg/dL (ref <10)

## 2020-08-03 LAB — TSH: TSH: 0.973 u[IU]/mL (ref 0.350–4.500)

## 2020-08-03 LAB — POC SARS CORONAVIRUS 2 AG: SARS Coronavirus 2 Ag: NEGATIVE

## 2020-08-03 MED ORDER — ACETAMINOPHEN 325 MG PO TABS
650.0000 mg | ORAL_TABLET | Freq: Four times a day (QID) | ORAL | Status: DC | PRN
  Administered 2020-08-03: 650 mg via ORAL
  Filled 2020-08-03: qty 2

## 2020-08-03 MED ORDER — ALUM & MAG HYDROXIDE-SIMETH 200-200-20 MG/5ML PO SUSP: 30.0000 mL | ORAL | Status: DC | PRN

## 2020-08-03 MED ORDER — QUETIAPINE FUMARATE 25 MG PO TABS: 25.0000 mg | ORAL_TABLET | Freq: Two times a day (BID) | ORAL | Status: DC | PRN

## 2020-08-03 MED ORDER — QUETIAPINE FUMARATE 50 MG PO TABS
50.0000 mg | ORAL_TABLET | Freq: Every day | ORAL | Status: DC
  Administered 2020-08-03: 50 mg via ORAL
  Filled 2020-08-03: qty 1

## 2020-08-03 MED ORDER — TRAZODONE HCL 50 MG PO TABS: 50.0000 mg | ORAL_TABLET | Freq: Every evening | ORAL | Status: DC | PRN

## 2020-08-03 MED ORDER — HYDROXYZINE HCL 25 MG PO TABS: 25.0000 mg | ORAL_TABLET | Freq: Three times a day (TID) | ORAL | Status: DC | PRN

## 2020-08-03 MED ORDER — MAGNESIUM HYDROXIDE 400 MG/5ML PO SUSP: 30.0000 mL | Freq: Every day | ORAL | Status: DC | PRN

## 2020-08-03 NOTE — BHH Counselor (Signed)
Collateral information obtained from patient's mother, [name] ([phone]: Patient has a history of bipolar disorder and schizophrenia. He was seen by [facility] in the past but does not currently have a provider. Patient's family is concerned as he displays bizarre behaviors such as sleeping outside. Mother states his mood is increasingly depressed.

## 2020-08-03 NOTE — ED Notes (Signed)
Patient resting on bed with eyes closed no distress. Patient remains safe on unit and monitoring continues.

## 2020-08-03 NOTE — ED Notes (Signed)
Patient denies SI. Patient oriented to unit and staff. Patient given meal and drink. Monitoring continues and patient remains safe on unit.

## 2020-08-03 NOTE — ED Provider Notes (Signed)
Behavioral Health Admission [facility]&P [facility] & OBS)  Date: 08/03/20 Patient Name: Eddie Sullivan MRN: [id number] Chief Complaint:  Chief Complaint  Patient presents with  . Psychiatric Evaluation   Chief Complaint/Presenting Problem: NA  Diagnoses:  Final diagnoses:  None    HPI: Patient is a 29 year old male that presented as a walk-in to the [facility] voluntarily.  Patient reports that he came alone and that he was living with his sister and he can return there.  However patient then states that he cannot return there because his sister's niece is having a baby and she states there as well and so he is unable to return.  He reports homicidal ideations with no specific plan towards no specific person.  Patient also reports passive suicidal ideations stating "sometimes it is there are sometimes it is not.".  Patient continues reporting that he is having trouble sleeping and is out been wandering the streets at night because he cannot sleep.  When asked how many days this is been going on patient reports that this is his first day of not sleeping.  He also reports having him visual hallucinations but gives no descriptions of them except for "I see things."  He reports a history of bipolar disorder and only remembers being on Vraylar in the past.  Patient presents to be very disheveled and flat affect.  Patient is lying back in his chair when I entered the room appearing as if he is severely tired and stated that he needs to sleep.  At this moment patient does not appear to be actively hallucinating or to be psychotic.  TTS informing that patient had said that he had came along with his mother's been waiting in the waiting room the entire time.  Will admit patient to continuous observation for assessment and will start patient on Seroquel 50 mg p.o. nightly and 25 mg p.o. twice daily as needed.  Will have all labs and COVID test done for patient for possible inpatient admission if needed.  There are  concerns for potential secondary gain from this patient presented to the [facility] due to being homeless now.  We will consider this and continued assessment.  PHQ 2-9:     Total Time spent with patient: 45 minutes  Musculoskeletal  Strength & Muscle Tone: within normal limits Gait & Station: normal Patient leans: N/A  Psychiatric Specialty Exam  Presentation General Appearance: Disheveled  Eye Contact:Minimal  Speech:Clear and Coherent;Normal Rate  Speech Volume:Decreased  Handedness:Right   Mood and Affect  Mood:Depressed  Affect:Flat   Thought Process  Thought Processes:Coherent  Descriptions of Associations:Intact  Orientation:Full (Time, Place and Person)  Thought Content:WDL  Hallucinations:Hallucinations: Visual Description of Visual Hallucinations: Reports "I just see things"  Ideas of Reference:None  Suicidal Thoughts:Suicidal Thoughts: Yes, Passive SI Passive Intent and/or Plan: Without Intent;Without Plan  Homicidal Thoughts:Homicidal Thoughts: Yes, Passive HI Passive Intent and/or Plan: Without Intent;Without Plan   Sensorium  Memory:Immediate Fair;Recent Fair;Remote Fair  Judgment:Fair  Insight:Fair   Executive Functions  Concentration:Fair  Attention Span:Fair  Recall:Good  Fund of Knowledge:Fair  Language:Fair   Psychomotor Activity  Psychomotor Activity:Psychomotor Activity: Decreased   Assets  Assets:Desire for Improvement;Financial Resources/Insurance;Physical Health   Sleep  Sleep:Sleep: Poor   Physical Exam ROS  Blood pressure (!) 166/140, pulse 100, temperature 97.9 F (36.6 C), temperature source Oral, resp. rate 18, height 5\' 10"  (1.778 m), weight 152 lb (68.9 kg), SpO2 (!) 68 %. Body mass index is 21.81 kg/m.  Past  Psychiatric History: cannabis use disorder   Is the patient at risk to self? Yes  Has the patient been a risk to self in the past 6 months? No .    Has the patient been a risk to self within  the distant past? No   Is the patient a risk to others? Yes   Has the patient been a risk to others in the past 6 months? No   Has the patient been a risk to others within the distant past? No   Past Medical History:  Past Medical History:  Diagnosis Date  . Seizures (HCC)    No past surgical history on file.  Family History:  Family History  Family history unknown: Yes    Social History:  Social History   Socioeconomic History  . Marital status: Single    Spouse name: Not on file  . Number of children: Not on file  . Years of education: Not on file  . Highest education level: Not on file  Occupational History  . Not on file  Tobacco Use  . Smoking status: Current Every Day Smoker  . Smokeless tobacco: Never Used  Vaping Use  . Vaping Use: Never used  Substance and Sexual Activity  . Alcohol use: Yes  . Drug use: Yes    Types: Marijuana  . Sexual activity: Yes  Other Topics Concern  . Not on file  Social History Narrative   ** Merged History Encounter **       Social Determinants of Health   Financial Resource Strain:   . Difficulty of Paying Living Expenses: Not on file  Food Insecurity:   . Worried About Programme researcher, broadcasting/film/video in the Last Year: Not on file  . Ran Out of Food in the Last Year: Not on file  Transportation Needs:   . Lack of Transportation (Medical): Not on file  . Lack of Transportation (Non-Medical): Not on file  Physical Activity:   . Days of Exercise per Week: Not on file  . Minutes of Exercise per Session: Not on file  Stress:   . Feeling of Stress : Not on file  Social Connections:   . Frequency of Communication with Friends and Family: Not on file  . Frequency of Social Gatherings with Friends and Family: Not on file  . Attends Religious Services: Not on file  . Active Member of Clubs or Organizations: Not on file  . Attends Banker Meetings: Not on file  . Marital Status: Not on file  Intimate Partner Violence:   .  Fear of Current or Ex-Partner: Not on file  . Emotionally Abused: Not on file  . Physically Abused: Not on file  . Sexually Abused: Not on file    SDOH:  SDOH Screenings   Alcohol Screen:   . Last Alcohol Screening Score (AUDIT): Not on file  Depression (PHQ2-9):   . PHQ-2 Score: Not on file  Financial Resource Strain:   . Difficulty of Paying Living Expenses: Not on file  Food Insecurity:   . Worried About Programme researcher, broadcasting/film/video in the Last Year: Not on file  . Ran Out of Food in the Last Year: Not on file  Housing:   . Last Housing Risk Score: Not on file  Physical Activity:   . Days of Exercise per Week: Not on file  . Minutes of Exercise per Session: Not on file  Social Connections:   . Frequency of Communication with Friends and Family:  Not on file  . Frequency of Social Gatherings with Friends and Family: Not on file  . Attends Religious Services: Not on file  . Active Member of Clubs or Organizations: Not on file  . Attends Banker Meetings: Not on file  . Marital Status: Not on file  Stress:   . Feeling of Stress : Not on file  Tobacco Use: High Risk  . Smoking Tobacco Use: Current Every Day Smoker  . Smokeless Tobacco Use: Never Used  Transportation Needs:   . Freight forwarder (Medical): Not on file  . Lack of Transportation (Non-Medical): Not on file    Last Labs:  Admission on 04/05/2020, Discharged on 04/05/2020  Component Date Value Ref Range Status  . Group A Strep by PCR 04/05/2020 NOT DETECTED  NOT DETECTED Final   Performed at [facility], [address] [place], [place] [address]  . Neisseria Gonorrhea 04/05/2020 Negative   Final  . Chlamydia 04/05/2020 Negative   Final  . Comment 04/05/2020 Normal Reference Ranger Chlamydia - Negative   Final  . Comment 04/05/2020 Normal Reference Range Neisseria Gonorrhea - Negative   Final  . SARS Coronavirus 2 04/05/2020 NEGATIVE  NEGATIVE Final   Comment: (NOTE) SARS-CoV-2 target  nucleic acids are NOT DETECTED. The SARS-CoV-2 RNA is generally detectable in upper and lower respiratory specimens during the acute phase of infection. Negative results do not preclude SARS-CoV-2 infection, do not rule out co-infections with other pathogens, and should not be used as the sole basis for treatment or other patient management decisions. Negative results must be combined with clinical observations, patient history, and epidemiological information. The expected result is Negative. Fact Sheet for Patients: HairSlick.no Fact Sheet for Healthcare Providers: quierodirigir.com This test is not yet approved or cleared by the [place] FDA and  has been authorized for detection and/or diagnosis of SARS-CoV-2 by FDA under an Emergency Use Authorization (EUA). This EUA will remain  in effect (meaning this test can be used) for the duration of the COVID-19 declaration under Section 56                          4(b)(1) of the Act, 21 U.S.C. section 360bbb-3(b)(1), unless the authorization is terminated or revoked sooner. Performed at [facility], [address] [place], [place] [address]     Allergies: Patient has no known allergies.  PTA Medications: (Not in a hospital admission)   Medical Decision Making  Labs and covid test ordered Start Seroquel 50 mg PO QHS and 25 mg PO BID PRN Admit to continuous observation    Recommendations  Based on my evaluation the patient does not appear to have an emergency medical condition.  Gerlene Burdock Jayde Daffin, FNP 08/03/20  11:32 AM

## 2020-08-03 NOTE — ED Notes (Signed)
Pt A&O calm & cooperative. Denies SI/HI/AVH. States he is having 'cluttered thoughts' due to 'dealing with a stressful situation'. Support provided: pt encouraged to write thoughts down to help clarify them. NAD
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
## 2020-08-03 NOTE — ED Notes (Signed)
Blood drawn for labs via right arm ac slightly above scarring spot from previous plasma donations per pt. Pt tolerated well. Pressure bandage applied. No new issues noted.

## 2020-08-03 NOTE — ED Notes (Signed)
Not able to obtain ekg at this time d/t machine malfunction. Work ticket made earlier

## 2020-08-03 NOTE — ED Notes (Signed)
Snack provided of fruit cup & muffin

## 2020-08-03 NOTE — ED Notes (Signed)
Escorted pt on to unit. A&O x4. Oriented to unit and staff. Placed in continuous assessment. Will continue to monitor for safety

## 2020-08-03 NOTE — ED Notes (Signed)
Patient belongings on locker 47

## 2020-08-04 MED ORDER — HYDROXYZINE HCL 25 MG PO TABS: 25.0000 mg | ORAL_TABLET | Freq: Three times a day (TID) | ORAL | 0 refills | Status: AC | PRN

## 2020-08-04 MED ORDER — QUETIAPINE FUMARATE 50 MG PO TABS: 50.0000 mg | ORAL_TABLET | Freq: Every day | ORAL | 0 refills | Status: AC

## 2020-08-04 NOTE — ED Notes (Signed)
Patient was able to go outside. Patient was supported and encouraged by [facility] staff.  Patient spoke with NP about treatment plan.

## 2020-08-04 NOTE — ED Notes (Signed)
Pt is outside in courtyard

## 2020-08-04 NOTE — ED Notes (Addendum)
D: Pt alert and oriented on the unit.   A: Education, support, and encouragement provided. Discharge summary, medications and follow up appointments reviewed with pt. Suicide prevention resources provided. Pt's belongings in lockerreturned and belongings sheet signed.  R: Pt denies SI/HI, A/VH, pain, or any concerns at this time. Pt ambulatory on and off unit. Pt discharged to lobby. 
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
